# Patient Record
Sex: Female | Born: 2000
Health system: Southern US, Community
[De-identification: ages and names within clinical notes are randomized; demographics above are authoritative.]

## PROBLEM LIST (undated history)

## (undated) DIAGNOSIS — F909 Attention-deficit hyperactivity disorder, unspecified type: Secondary | ICD-10-CM

## (undated) DIAGNOSIS — R278 Other lack of coordination: Secondary | ICD-10-CM

## (undated) HISTORY — PX: WISDOM TOOTH EXTRACTION: SHX21

## (undated) HISTORY — DX: Other lack of coordination: R27.8

## (undated) HISTORY — DX: Attention-deficit hyperactivity disorder, unspecified type: F90.9

---
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
## 2020-01-07 ENCOUNTER — Other Ambulatory Visit: Payer: Self-pay | Admitting: Family Medicine

## 2020-01-14 DIAGNOSIS — H5213 Myopia, bilateral: Secondary | ICD-10-CM | POA: Diagnosis not present

## 2020-01-14 DIAGNOSIS — H10413 Chronic giant papillary conjunctivitis, bilateral: Secondary | ICD-10-CM | POA: Diagnosis not present

## 2020-01-17 ENCOUNTER — Encounter: Payer: BC Managed Care – PPO | Admitting: Pediatrics

## 2020-01-19 ENCOUNTER — Other Ambulatory Visit: Payer: Self-pay

## 2020-01-19 ENCOUNTER — Encounter: Payer: Self-pay | Admitting: Pediatrics

## 2020-01-19 ENCOUNTER — Ambulatory Visit (INDEPENDENT_AMBULATORY_CARE_PROVIDER_SITE_OTHER): Payer: BC Managed Care – PPO | Admitting: Pediatrics

## 2020-01-19 VITALS — Ht 64.0 in | Wt 120.0 lb

## 2020-01-19 DIAGNOSIS — F900 Attention-deficit hyperactivity disorder, predominantly inattentive type: Secondary | ICD-10-CM

## 2020-01-19 DIAGNOSIS — R278 Other lack of coordination: Secondary | ICD-10-CM | POA: Diagnosis not present

## 2020-01-19 DIAGNOSIS — Z79899 Other long term (current) drug therapy: Secondary | ICD-10-CM | POA: Diagnosis not present

## 2020-01-19 DIAGNOSIS — Z0282 Encounter for adoption services: Secondary | ICD-10-CM

## 2020-01-19 DIAGNOSIS — Z7189 Other specified counseling: Secondary | ICD-10-CM

## 2020-01-19 DIAGNOSIS — Z719 Counseling, unspecified: Secondary | ICD-10-CM

## 2020-01-19 MED ORDER — METHYLPHENIDATE HCL ER (OSM) 36 MG PO TBCR: 72.0000 mg | EXTENDED_RELEASE_TABLET | ORAL | 0 refills | Status: DC

## 2020-01-19 MED ORDER — METHYLPHENIDATE HCL 10 MG PO TABS: ORAL_TABLET | ORAL | 0 refills | Status: DC

## 2020-01-19 NOTE — Progress Notes (Signed)
Medication Check  Patient ID: Sylvia Dawson  DOB: [birth year]  MRN: [id number]  DATE:01/19/20 Leveda Anna, NP  Accompanied by: Mother Patient Lives with: mother and father  HISTORY/CURRENT STATUS: Chief Complaint - Polite and cooperative and present for medical follow up for medication management of ADHD, dysgraphia and learning differences. Last follow up by video on 09/13/19 and in person on 08/07/2018.  Currently prescribed Concerta 36 mg BID, and currently taking one in the summer.  Also prescribed Ritalin 10 mg as needed, will take this rather than concerta 36 mg if she is waking up later.    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 Travelling this summer. No employment  Finished most Gen Ed.  - will have math, and will take Eng in Spring Major undecided.  Activities/ Exercise: daily  Second semester did PE/Swim Electives  Screen time: (phone, tablet, TV, computer): some excessive  Lives in apartment with one roommate. - will be random placement. 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  MEDICAL HISTORY: Appetite: WNL   Sleep: Bedtime: 2300  Awakens: 0700   Concerns: Initiation/Maintenance/Other: Asleep easily, sleeps through the night, feels well-rested.  No Sleep concerns. Counseled to have routine sleep with usual bedtime and wake up on schedule. Elimination: no concerns  Individual Medical History/ Review of Systems: Changes? :Yes counselor every other week by video feels it is helpful Reports challenged relationship with on-line bf, lives in [place] only met once during spring break. Advised continuation of counseling and off phones and asleeping set hours.  Family Medical/ Social History: Changes? No  Current Medications:  Concerta 36 mg two daily Ritalin 10 mg prn Medication Side Effects: None  MENTAL HEALTH: Mental Health Issues:   Denies sadness, loneliness or depression. No self harm or thoughts of self harm or injury. Denies fears, worries and anxieties. Has good peer relations and is not a bully nor is victimized.  Review of Systems  Constitutional: Negative.   HENT: Negative.   Eyes: Negative.   Respiratory: Negative.   Cardiovascular: Negative.   Gastrointestinal: Negative.   Genitourinary: Negative.   Musculoskeletal: Negative.   Skin: Negative.   Allergic/Immunologic: Negative.   Neurological: Negative.   Hematological: Negative.   Psychiatric/Behavioral: Negative.  Negative for confusion, decreased concentration and sleep disturbance. The patient is not nervous/anxious and is not hyperactive.   All other systems reviewed and are negative.   PHYSICAL EXAM; Vitals:   01/19/20 1538  Weight: 120 lb (54.4 kg)  Height: 5' 4" (1.626 m)   Body mass index is 20.6 kg/m.  General Physical Exam: Unchanged from previous exam, date:08/07/2018   DIAGNOSES:    ICD-10-CM   1. ADHD (attention deficit hyperactivity disorder), inattentive type  F90.0   2. Dysgraphia  R27.8   3. Adopted  Z02.82   4. Medication management  Z79.899   5. Patient counseled  Z71.9   6. Counseling and coordination of care  Z71.89     RECOMMENDATIONS:  Patient Instructions  DISCUSSION: Counseled regarding the following coordination of care items:  Continue medication as directed Concerta 36 mg one BID Ritalin 10 mg as needed RX for above e-scribed and sent to pharmacy on record  [id number] [place] [place] - [id number] [address]Phone: [phone]: [phone] Counseled regarding obtaining refills by  calling pharmacy first to use automated refill request then if needed, call our office leaving a detailed message on the refill line.  Counseled medication administration, effects, and possible side effects.  ADHD medications discussed to include  different medications and pharmacologic properties of each. Recommendation for specific medication to include dose, administration, expected effects, possible side effects and the risk to benefit ratio of medication management.  Advised importance of:  Good sleep hygiene (8- 10 hours per night)  Limited screen time (none on school nights, no more than 2 hours on weekends)  Regular exercise(outside and active play)  Healthy eating (drink water, no sodas/sweet tea)  Regular family meals have been linked to lower levels of adolescent risk-taking behavior.  Adolescents who frequently eat meals with their family are less likely to engage in risk behaviors than those who never or rarely eat with their families.  So it is never too early to start this tradition.    Counseling at this visit included the review of old records and/or current chart.   Counseling included the following discussion points presented at every visit to improve understanding and treatment compliance.  Recent health history and today's examination Growth and development with anticipatory guidance provided regarding brain growth, executive function maturation and pre or pubertal development. School progress and continued advocay for appropriate accommodations to include maintain Structure, routine, organization, reward, motivation and consequences.  Additionally the patient was counseled to take medication while driving.  Decrease video/screen time including phones, tablets, television and computer games. None on school nights.  Only 2 hours total on weekend days.  Technology bedtime - off devices two hours before sleep  Please only permit age appropriate gaming:    Https://www.commonsensemedia.org/  Setting Parental Controls:  https://endsexualexploitation.org/articles/steam-family-view/ Https://support.google.com/googleplay/answer/1075738?hl=en  To block content on cell phones:   https://ourpact.com/iphone-parental-controls-app/  https://www.missingkids.org/netsmartz/resources#tipsheets  Screen usage is associated with decreased academic success, lower self-esteem and more social isolation. Screens increase Impulsive behaviors, decrease attention necessary for school and it IMPAIRS sleep.     Patient verbalized understanding of all topics discussed.  NEXT APPOINTMENT:  Return in about 6 months (around 07/21/2020) for Medical Follow up.  Medical Decision-making: More than 50% of the appointment was spent counseling and discussing diagnosis and management of symptoms with the patient and family.  Counseling Time: 25 minutes Total Contact Time: 30 minutes  

## 2020-01-19 NOTE — Patient Instructions (Addendum)
DISCUSSION: Counseled regarding the following coordination of care items:  Continue medication as directed Concerta 36 mg one BID Ritalin 10 mg as needed RX for above e-scribed and sent to pharmacy on record  [id number] - [place], [place] - [address] [place] [address] Phone: [phone] Fax: [phone]  Counseled regarding obtaining refills by calling pharmacy first to use automated refill request then if needed, call our office leaving a detailed message on the refill line.  Counseled medication administration, effects, and possible side effects.  ADHD medications discussed to include different medications and pharmacologic properties of each. Recommendation for specific medication to include dose, administration, expected effects, possible side effects and the risk to benefit ratio of medication management.  Advised importance of:  Good sleep hygiene (8- 10 hours per night)  Limited screen time (none on school nights, no more than 2 hours on weekends)  Regular exercise(outside and active play)  Healthy eating (drink water, no sodas/sweet tea)  Regular family meals have been linked to lower levels of adolescent risk-taking behavior.  Adolescents who frequently eat meals with their family are less likely to engage in risk behaviors than those who never or rarely eat with their families.  So it is never too early to start this tradition.    Counseling at this visit included the review of old records and/or current chart.   Counseling included the following discussion points presented at every visit to improve understanding and treatment compliance.  Recent health history and today's examination Growth and development with anticipatory guidance provided regarding brain growth, executive function maturation and pre or pubertal development. School progress and continued advocay for appropriate accommodations to include maintain  Structure, routine, organization, reward, motivation and consequences.  Additionally the patient was counseled to take medication while driving.  Decrease video/screen time including phones, tablets, television and computer games. None on school nights.  Only 2 hours total on weekend days.  Technology bedtime - off devices two hours before sleep  Please only permit age appropriate gaming:    http://knight.com/  Setting Parental Controls:  https://endsexualexploitation.org/articles/steam-family-view/ Https://support.google.com/googleplay/answer/1075738?hl=en  To block content on cell phones:  TownRank.com.cy  https://www.missingkids.org/netsmartz/resources#tipsheets  Screen usage is associated with decreased academic success, lower self-esteem and more social isolation. Screens increase Impulsive behaviors, decrease attention necessary for school and it IMPAIRS sleep.

## 2020-01-20 ENCOUNTER — Encounter: Payer: Self-pay | Admitting: Family Medicine

## 2020-01-20 ENCOUNTER — Ambulatory Visit (HOSPITAL_BASED_OUTPATIENT_CLINIC_OR_DEPARTMENT_OTHER): Payer: BC Managed Care – PPO | Admitting: Family Medicine

## 2020-01-20 ENCOUNTER — Other Ambulatory Visit (HOSPITAL_COMMUNITY)
Admission: RE | Admit: 2020-01-20 | Discharge: 2020-01-20 | Disposition: A | Discharge status: Home / Self Care | Payer: BC Managed Care – PPO | Source: Ambulatory Visit | Attending: Obstetrics & Gynecology | Admitting: Obstetrics & Gynecology

## 2020-01-20 VITALS — BP 122/83 | HR 107 | Ht 64.0 in | Wt 120.0 lb

## 2020-01-20 DIAGNOSIS — Z01419 Encounter for gynecological examination (general) (routine) without abnormal findings: Secondary | ICD-10-CM | POA: Diagnosis not present

## 2020-01-20 DIAGNOSIS — N898 Other specified noninflammatory disorders of vagina: Secondary | ICD-10-CM | POA: Diagnosis not present

## 2020-01-20 DIAGNOSIS — Z113 Encounter for screening for infections with a predominantly sexual mode of transmission: Secondary | ICD-10-CM | POA: Insufficient documentation

## 2020-01-20 MED ORDER — LO LOESTRIN FE 1 MG-10 MCG / 10 MCG PO TABS: 1.0000 | ORAL_TABLET | Freq: Every day | ORAL | 3 refills | Status: DC

## 2020-01-20 NOTE — Progress Notes (Signed)
GYNECOLOGY ANNUAL PREVENTATIVE CARE ENCOUNTER NOTE  Subjective:   Sylvia Dawson is a 19 y.o. No obstetric history on file. female here for a routine annual gynecologic exam.  Current complaints: discharge with strong odor.   Denies abnormal vaginal bleeding, discharge, pelvic pain, problems with intercourse or other gynecologic concerns.    Gynecologic History No LMP recorded. (Menstrual status: Oral contraceptives). Patient is sexually active  Contraception: OCP (estrogen/progesterone) Last Pap: n/a.  Last mammogram: n/a.   Obstetric History OB History  No obstetric history on file.    Past Medical History:  Diagnosis Date  . ADHD (attention deficit hyperactivity disorder)   . Dysgraphia 09/28/2015    Past Surgical History:  Procedure Laterality Date  . WISDOM TOOTH EXTRACTION      Current Outpatient Medications on File Prior to Visit  Medication Sig Dispense Refill  . LO LOESTRIN FE 1 MG-10 MCG / 10 MCG tablet Take 1 tablet by mouth daily. 3 Package 3  . loratadine (CLARITIN) 10 MG tablet Take 10 mg by mouth daily.    . methylphenidate (CONCERTA) 36 MG PO CR tablet Take 2 tablets (72 mg total) by mouth every morning. 60 tablet 0  . methylphenidate (RITALIN) 10 MG tablet May take one tablet in the am for testing days and one in the evening, as needed for homework 60 tablet 0  . Biotin 10 MG CAPS Take by mouth.    . Omega-3 Fatty Acids (FISH OIL) 1000 MG CAPS Take by mouth.    . VENTOLIN HFA 108 (90 Base) MCG/ACT inhaler INHALE 1-2 PUFFS INTO THE LUNGS EVERY 6 (SIX) HOURS AS NEEDED FOR WHEEZING OR SHORTNESS OF BREATH. 8 g 4   No current facility-administered medications on file prior to visit.    Allergies  Allergen Reactions  . Augmentin [Amoxicillin-Pot Clavulanate] Rash    With subsequent rashes after discontinued.    Social History   Socioeconomic History  . Marital status: Single    Spouse name: Not on file  . Number of children: Not on file  . Years of  education: 11  . Highest education level: Not on file  Occupational History  . Not on file  Tobacco Use  . Smoking status: Never Smoker  . Smokeless tobacco: Never Used  Vaping Use  . Vaping Use: Never used  Substance and Sexual Activity  . Alcohol use: No    Alcohol/week: 0.0 standard drinks  . Drug use: No  . Sexual activity: Never    Birth control/protection: Pill  Other Topics Concern  . Not on file  Social History Narrative   Lives with adoptive Mother and Father, no siblings.   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 Difficulty of Paying Living Expenses:   Food Insecurity:   . Worried About Programme researcher, broadcasting/film/video in the Last Year:   . Barista in the Last Year:   Transportation Needs:   . Freight forwarder (Medical):   Marland Kitchen Lack of Transportation (Non-Medical):   Physical Activity:   . Days of Exercise per Week:   . Minutes of Exercise per Session:   Stress:   . Feeling of Stress :   Social Connections:   . Frequency of Communication with Friends and Family:   . Frequency of Social Gatherings with Friends and Family:   . Attends Religious  Services:   . Active Member of Clubs or Organizations:   . Attends Banker Meetings:   Marland Kitchen Marital Status:   Intimate Partner Violence:   . Fear of Current or Ex-Partner:   . Emotionally Abused:   Marland Kitchen Physically Abused:   . Sexually Abused:     Family History  Adopted: Yes  Family history unknown: Yes    The following portions of the patient's history were reviewed and updated as appropriate: allergies, current medications, past family history, past medical history, past social history, past surgical history and problem list.  Review of Systems Pertinent items are noted in HPI.   Objective:  BP 122/83   Pulse (!) 107   Ht 5\' 4"  (1.626 m)   Wt 120 lb (54.4 kg)   BMI 20.60  kg/m  Wt Readings from Last 3 Encounters:  01/20/20 120 lb (54.4 kg) (37 %, Z= -0.33)*  01/11/19 119 lb (54 kg) (40 %, Z= -0.25)*  05/17/18 123 lb 7.3 oz (56 kg) (52 %, Z= 0.06)*   * Growth percentiles are based on CDC (Girls, 2-20 Years) data.     Chaperone present during exam  CONSTITUTIONAL: Well-developed, well-nourished female in no acute distress.  HENT:  Normocephalic, atraumatic, External right and left ear normal. Oropharynx is clear and moist EYES: Conjunctivae and EOM are normal. Pupils are equal, round, and reactive to light. No scleral icterus.  NECK: Normal range of motion, supple, no masses.  Normal thyroid.   CARDIOVASCULAR: Normal heart rate noted, regular rhythm RESPIRATORY: Clear to auscultation bilaterally. Effort and breath sounds normal, no problems with respiration noted. BREASTS: Not indicated ABDOMEN: Soft, normal bowel sounds, no distention noted.  No tenderness, rebound or guarding.  PELVIC: Not indicated. masses, no uterine or adnexal tenderness. MUSCULOSKELETAL: Normal range of motion. No tenderness.  No cyanosis, clubbing, or edema.  2+ distal pulses. SKIN: Skin is warm and dry. No rash noted. Not diaphoretic. No erythema. No pallor. NEUROLOGIC: Alert and oriented to person, place, and time. Normal reflexes, muscle tone coordination. No cranial nerve deficit noted. PSYCHIATRIC: Normal mood and affect. Normal behavior. Normal judgment and thought content.  Assessment:  Annual gynecologic examination with pap smear   Plan:  1. Well Woman Exam STD testing discussed. Patient requested vaginal testing   2. Discharge Wet prep.  Routine preventative health maintenance measures emphasized. Please refer to After Visit Summary for other counseling recommendations.    13/10/19, DO [facility]

## 2020-01-20 NOTE — Progress Notes (Signed)
Patient is taking her pills continously. Patient is attending [employer] Armandina Stammer RN

## 2020-01-20 NOTE — Addendum Note (Signed)
Addended by: Anell Barr on: 01/20/2020 01:57 PM   Modules accepted: Orders

## 2020-01-21 LAB — CERVICOVAGINAL ANCILLARY ONLY
Bacterial Vaginitis (gardnerella): POSITIVE — AB
Candida Glabrata: NEGATIVE
Candida Vaginitis: NEGATIVE
Chlamydia: POSITIVE — AB
Comment: NEGATIVE
Comment: NEGATIVE
Comment: NEGATIVE
Comment: NEGATIVE
Comment: NEGATIVE
Comment: NORMAL
Neisseria Gonorrhea: NEGATIVE
Trichomonas: NEGATIVE

## 2020-01-24 ENCOUNTER — Other Ambulatory Visit: Payer: Self-pay | Admitting: Family Medicine

## 2020-01-24 ENCOUNTER — Telehealth: Payer: Self-pay

## 2020-01-24 MED ORDER — FLUCONAZOLE 150 MG PO TABS: 150.0000 mg | ORAL_TABLET | Freq: Once | ORAL | 0 refills | Status: AC

## 2020-01-24 MED ORDER — METRONIDAZOLE 500 MG PO TABS: 500.0000 mg | ORAL_TABLET | Freq: Two times a day (BID) | ORAL | 0 refills | Status: AC

## 2020-01-24 MED ORDER — AZITHROMYCIN 250 MG PO TABS: 1000.0000 mg | ORAL_TABLET | Freq: Once | ORAL | 0 refills | Status: AC

## 2020-01-24 NOTE — Telephone Encounter (Signed)
Patient called and made aware that she has bacterial vaginosis and chlamydia. Made aware that she will need to the azithromycin (4 tabs) now and give 4 tablets to her partner. Patient states she doesn't have a partner and doesn't know who she had intercourse with who had the infection. Stressed the importance of her not having intercourse until she is treated and using condoms.  Patient also made aware that she has a script for flagyl for the bacterial vaginosis. And a diflucan she can take at the end of all these medications if she develops a yeast infection.  Patient states understanding. Patient also asked which covid vaccine dr. Erin Fulling would recommend and I let her know she would recommend whichever one she can get through [facility] - I made her aware this is usually the pfizer. Armandina Stammer RN

## 2020-02-02 ENCOUNTER — Ambulatory Visit: Payer: Self-pay | Admitting: Obstetrics & Gynecology

## 2020-04-20 DIAGNOSIS — F909 Attention-deficit hyperactivity disorder, unspecified type: Secondary | ICD-10-CM | POA: Diagnosis not present

## 2020-04-21 ENCOUNTER — Other Ambulatory Visit: Payer: Self-pay | Admitting: Pediatrics

## 2020-04-21 MED ORDER — METHYLPHENIDATE HCL ER (OSM) 36 MG PO TBCR: 72.0000 mg | EXTENDED_RELEASE_TABLET | ORAL | 0 refills | Status: DC

## 2020-04-21 MED ORDER — METHYLPHENIDATE HCL 10 MG PO TABS: ORAL_TABLET | ORAL | 0 refills | Status: DC

## 2020-04-21 NOTE — Addendum Note (Signed)
Addended by: Elvera Maria R on: 04/21/2020 05:04 PM   Modules accepted: Orders

## 2020-04-21 NOTE — Telephone Encounter (Signed)
There was an e-prescribing error Resent the Rx for Concerta and methylphenidate IR

## 2020-04-21 NOTE — Telephone Encounter (Signed)
Mother requested refills. RX for above e-scribed and sent to pharmacy on record  [id number] - [place] [place] - [address] [place] [address] Phone: [phone] Fax: [phone]

## 2020-04-26 ENCOUNTER — Telehealth (INDEPENDENT_AMBULATORY_CARE_PROVIDER_SITE_OTHER): Payer: BC Managed Care – PPO | Admitting: Pediatrics

## 2020-04-26 ENCOUNTER — Other Ambulatory Visit: Payer: Self-pay

## 2020-04-26 DIAGNOSIS — F900 Attention-deficit hyperactivity disorder, predominantly inattentive type: Secondary | ICD-10-CM

## 2020-04-26 DIAGNOSIS — Z7189 Other specified counseling: Secondary | ICD-10-CM

## 2020-04-26 DIAGNOSIS — R278 Other lack of coordination: Secondary | ICD-10-CM | POA: Diagnosis not present

## 2020-04-26 DIAGNOSIS — F4329 Adjustment disorder with other symptoms: Secondary | ICD-10-CM | POA: Diagnosis not present

## 2020-04-26 DIAGNOSIS — Z0282 Encounter for adoption services: Secondary | ICD-10-CM

## 2020-04-27 ENCOUNTER — Encounter: Payer: Self-pay | Admitting: Pediatrics

## 2020-04-27 NOTE — Progress Notes (Signed)
[facility] [address], [place] [place] [address] Dept: [phone] Dept Fax: [phone]  Medication Check by Caregility due to COVID-19  Patient ID:  Sylvia Dawson  female DOB: 2000/10/09   19 y.o.   MRN: [id number]   DATE:04/27/20  PCP: Estelle June, NP  Interviewed: Sylvia Dawson and Sylvia Dawson  Name: Sylvia Dawson Location: Their home Provider location: [facility] office  Virtual Visit via Video Note Connected with Sylvia Dawson on 04/27/20 at  2:00 PM EDT by video enabled telemedicine application and verified that I am speaking with the correct person using two identifiers.     I discussed the limitations, risks, security and privacy concerns of performing an evaluation and management service by telephone and the availability of in person appointments. I also discussed with the parent/patient that there may be a patient responsible charge related to this service. The parent/patient expressed understanding and agreed to proceed.  HISTORY OF PRESENT ILLNESS/CURRENT STATUS: Sylvia Dawson is being followed for medication management for ADHD, dysgraphia and learning differences.   Last visit on 01/19/2020  Sylvia Dawson currently prescribed Concerta 36 mg one or two daily, with short acting ritalin 10 mg as needed.    Behaviors: Sylvia Dawson extremely concerned with inconsistent medication use.  Has enmeshed relationship with boyfriend of one year who is in HS in [place] (same birth age but 3 years younger in school).  EDUCATION: School: Sylvia Dawson Year/Grade: sophomore year Sylvia Dawson concerned for academic progress and social immaturity  Activities/ Exercise: daily  Limited activities. Boyfriend has her on 360 app and text her when she is out of her circle  Screen time: (phone, tablet, TV, computer): non-essential, excessive with BF  MEDICAL HISTORY: Individual Medical History/ Review of Systems: Changes? :No  Family  Medical/ Social History: Changes? No Patient Lives with: housing  Current Medications:  Inconsistant Medication Side Effects: None  MENTAL HEALTH: Sylvia Dawson rightfully worried regarding toxicity of relationship Advised to see continued counseling with local provider in [place] Also may need transition to psychiatry due to mental health concerns of low self esteem, and stagnation of progressing to confident sense of self  DIAGNOSES:    ICD-10-CM   1. ADHD (attention deficit hyperactivity disorder), inattentive type  F90.0   2. Dysgraphia  R27.8   3. Adopted  Z02.82   4. 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 Parenting dynamics counseling  Z71.89   6. Counseling and coordination of care  Z71.89      RECOMMENDATIONS:  Patient Instructions  DISCUSSION: Counseled regarding the following coordination of care items:  Continue medication as directed  Counseled regarding obtaining refills by calling pharmacy first to use automated refill request then if needed, call our office leaving a detailed message on the refill line.  Counseled medication administration, effects, and possible side effects.  ADHD medications discussed to include different medications and pharmacologic properties of each. Recommendation for specific medication to include dose, administration, expected effects, possible side effects and the risk to benefit ratio of medication management.  Sylvia Dawson advised to recommend continued counseling for patient and continue to encourage in person engagement for social activities.      NEXT APPOINTMENT:  Return in about 2 weeks (around 05/10/2020) for Medication Check. Please call the office for a sooner appointment if problems arise.  Medical Decision-making: More than 50% of the appointment was spent counseling and discussing diagnosis and management of symptoms with  the parent/patient.  I discussed the assessment and treatment plan with the  parent. The parent/patient was provided an opportunity to ask questions and all were answered. The parent/patient agreed with the plan and demonstrated an understanding of the instructions.   The parent/patient was advised to call back or seek an in-person evaluation if the symptoms worsen or if the condition fails to improve as anticipated.  I provided 35 minutes of non-face-to-face time during this encounter.   Completed record review for 10 minutes prior to the virtual video visit.   Sylvia Dawson A Harrold Donath, NP  Counseling Time: 35 minutes   Total Contact Time: 45 minutes

## 2020-04-27 NOTE — Patient Instructions (Signed)
DISCUSSION: Counseled regarding the following coordination of care items:  Continue medication as directed  Counseled regarding obtaining refills by calling pharmacy first to use automated refill request then if needed, call our office leaving a detailed message on the refill line.  Counseled medication administration, effects, and possible side effects.  ADHD medications discussed to include different medications and pharmacologic properties of each. Recommendation for specific medication to include dose, administration, expected effects, possible side effects and the risk to benefit ratio of medication management.  Mother advised to recommend continued counseling for patient and continue to encourage in person engagement for social activities.

## 2020-05-03 ENCOUNTER — Encounter: Payer: Self-pay | Admitting: Pediatrics

## 2020-05-03 ENCOUNTER — Telehealth (INDEPENDENT_AMBULATORY_CARE_PROVIDER_SITE_OTHER): Payer: BC Managed Care – PPO | Admitting: Pediatrics

## 2020-05-03 ENCOUNTER — Other Ambulatory Visit: Payer: Self-pay

## 2020-05-03 DIAGNOSIS — Z79899 Other long term (current) drug therapy: Secondary | ICD-10-CM | POA: Diagnosis not present

## 2020-05-03 DIAGNOSIS — F900 Attention-deficit hyperactivity disorder, predominantly inattentive type: Secondary | ICD-10-CM

## 2020-05-03 DIAGNOSIS — Z7189 Other specified counseling: Secondary | ICD-10-CM

## 2020-05-03 DIAGNOSIS — Z0282 Encounter for adoption services: Secondary | ICD-10-CM

## 2020-05-03 DIAGNOSIS — R278 Other lack of coordination: Secondary | ICD-10-CM | POA: Diagnosis not present

## 2020-05-03 DIAGNOSIS — Z719 Counseling, unspecified: Secondary | ICD-10-CM

## 2020-05-03 MED ORDER — AMPHETAMINE SULFATE 10 MG PO TABS: 5.0000 mg | ORAL_TABLET | Freq: Every morning | ORAL | 0 refills | Status: AC

## 2020-05-03 NOTE — Patient Instructions (Signed)
DISCUSSION: Counseled regarding the following coordination of care items:  Discontinue Concerta and short acting methylphenidate  Trial Evekeo 10 mg begin with 1/2 tablet in the morning, may use full tablet.  May add 1/2 tablet to afternoon for activities/homework. RX for above e-scribed and sent to pharmacy on record  [id number] - [place], [place] - [address] [place] [address] Phone: [phone] Fax: [phone]  Counseled regarding obtaining refills by calling pharmacy first to use automated refill request then if needed, call our office leaving a detailed message on the refill line.  Counseled medication administration, effects, and possible side effects.  ADHD medications discussed to include different medications and pharmacologic properties of each. Recommendation for specific medication to include dose, administration, expected effects, possible side effects and the risk to benefit ratio of medication management.  Advised importance of:  Good sleep hygiene (8- 10 hours per night)  Limited screen time (none on school nights, no more than 2 hours on weekends)  Regular exercise(outside and active play)  Healthy eating (drink water, no sodas/sweet tea)  Regular family meals have been linked to lower levels of adolescent risk-taking behavior.  Adolescents who frequently eat meals with their family are less likely to engage in risk behaviors than those who never or rarely eat with their families.  So it is never too early to start this tradition.  Counseling at this visit included the review of old records and/or current chart.   Counseling included the following discussion points presented at every visit to improve understanding and treatment compliance.  Recent health history and today's examination Growth and development with anticipatory guidance provided regarding brain growth, executive function maturation and pre or  pubertal development. School progress and continued advocay for appropriate accommodations to include maintain Structure, routine, organization, reward, motivation and consequences.  Additionally the patient was counseled to take medication while driving.
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
## 2020-05-04 ENCOUNTER — Telehealth: Payer: Self-pay

## 2020-05-04 NOTE — Telephone Encounter (Signed)
Pharm faxed in Prior Auth for Evekeo. Last visit 05/03/2020. Submitting Prior Auth to [employer]

## 2020-05-25 DIAGNOSIS — F900 Attention-deficit hyperactivity disorder, predominantly inattentive type: Secondary | ICD-10-CM | POA: Diagnosis not present

## 2020-06-07 ENCOUNTER — Other Ambulatory Visit: Payer: Self-pay

## 2020-06-07 ENCOUNTER — Encounter: Payer: Self-pay | Admitting: Family Medicine

## 2020-06-07 ENCOUNTER — Ambulatory Visit (INDEPENDENT_AMBULATORY_CARE_PROVIDER_SITE_OTHER): Payer: BC Managed Care – PPO | Admitting: Family Medicine

## 2020-06-07 VITALS — BP 120/55 | HR 71 | Wt 123.0 lb

## 2020-06-07 DIAGNOSIS — F3281 Premenstrual dysphoric disorder: Secondary | ICD-10-CM

## 2020-06-07 DIAGNOSIS — N921 Excessive and frequent menstruation with irregular cycle: Secondary | ICD-10-CM

## 2020-06-07 NOTE — Progress Notes (Addendum)
Subjective:    Patient ID: Sylvia Dawson, female    DOB: 2000/10/13, 19 y.o.   MRN: [id number]  HPI Patient seen for f.u of birth control. Seen with mother. On Lo loestrin (5mg estrogen COC). Sometimes has period on non-hormone days, but also sometimes having menses on the second week of the pack. Never has both menses and breakthrough bleeding. Finds that this happens more often when she has increased stress. Did try loestrin (23m estrogen), but did not tolerate this well - nausea, upset stomach - but only tried it for a few weeks.   Also gets irritated with mood changes a few days prior to her menses ("I can be a real bitch"). Is seeing a counselor, who recommended some labs.   Review of Systems     Objective:   Physical Exam Vitals reviewed.  Cardiovascular:     Rate and Rhythm: Normal rate.     Pulses: Normal pulses.  Pulmonary:     Effort: Pulmonary effort is normal.  Skin:    Capillary Refill: Capillary refill takes less than 2 seconds.  Neurological:     General: No focal deficit present.  Psychiatric:        Mood and Affect: Mood normal.        Behavior: Behavior normal.        Thought Content: Thought content normal.        Judgment: Judgment normal.       Assessment & Plan:  1. Breakthrough bleeding on birth control pills Discussed increasing dose of estrogen again and giving longer trial - may change progesterone and see if that helps. At this point, patient does not want to change COCs.   2. PMDD (premenstrual dysphoric disorder) Discussed that this is common and due to hormone drop. Increasing the estrogen dose and doing it continuously may help with these symptoms. Patient currently does not want to change. - TSH - Comp Met (CMET) - CBC - Hemoglobin A1c

## 2020-06-08 DIAGNOSIS — F900 Attention-deficit hyperactivity disorder, predominantly inattentive type: Secondary | ICD-10-CM | POA: Diagnosis not present

## 2020-06-08 LAB — COMPREHENSIVE METABOLIC PANEL
ALT: 15 IU/L (ref 0–32)
AST: 13 IU/L (ref 0–40)
Albumin/Globulin Ratio: 2.1 (ref 1.2–2.2)
Albumin: 4.9 g/dL (ref 3.9–5.0)
Alkaline Phosphatase: 42 IU/L (ref 42–106)
BUN/Creatinine Ratio: 17 (ref 9–23)
BUN: 12 mg/dL (ref 6–20)
Bilirubin Total: 0.3 mg/dL (ref 0.0–1.2)
CO2: 21 mmol/L (ref 20–29)
Calcium: 9.7 mg/dL (ref 8.7–10.2)
Chloride: 104 mmol/L (ref 96–106)
Creatinine, Ser: 0.7 mg/dL (ref 0.57–1.00)
GFR calc Af Amer: 145 mL/min/{1.73_m2} (ref >59)
GFR calc non Af Amer: 126 mL/min/{1.73_m2} (ref >59)
Globulin, Total: 2.3 g/dL (ref 1.5–4.5)
Glucose: 91 mg/dL (ref 65–99)
Potassium: 4.4 mmol/L (ref 3.5–5.2)
Sodium: 140 mmol/L (ref 134–144)
Total Protein: 7.2 g/dL (ref 6.0–8.5)

## 2020-06-08 LAB — CBC
Hematocrit: 43.1 % (ref 34.0–46.6)
Hemoglobin: 14.4 g/dL (ref 11.1–15.9)
MCH: 28.5 pg (ref 26.6–33.0)
MCHC: 33.4 g/dL (ref 31.5–35.7)
MCV: 85 fL (ref 79–97)
Platelets: 223 10*3/uL (ref 150–450)
RBC: 5.05 x10E6/uL (ref 3.77–5.28)
RDW: 12.8 % (ref 11.7–15.4)
WBC: 4.9 10*3/uL (ref 3.4–10.8)

## 2020-06-08 LAB — HEMOGLOBIN A1C
Est. average glucose Bld gHb Est-mCnc: 105 mg/dL
Hgb A1c MFr Bld: 5.3 % (ref 4.8–5.6)

## 2020-06-08 LAB — TSH: TSH: 0.955 u[IU]/mL (ref 0.450–4.500)

## 2020-06-12 DIAGNOSIS — F900 Attention-deficit hyperactivity disorder, predominantly inattentive type: Secondary | ICD-10-CM | POA: Diagnosis not present

## 2020-06-21 DIAGNOSIS — F900 Attention-deficit hyperactivity disorder, predominantly inattentive type: Secondary | ICD-10-CM | POA: Diagnosis not present

## 2020-06-27 DIAGNOSIS — F900 Attention-deficit hyperactivity disorder, predominantly inattentive type: Secondary | ICD-10-CM | POA: Diagnosis not present

## 2020-07-14 DIAGNOSIS — F900 Attention-deficit hyperactivity disorder, predominantly inattentive type: Secondary | ICD-10-CM | POA: Diagnosis not present

## 2020-08-02 DIAGNOSIS — F900 Attention-deficit hyperactivity disorder, predominantly inattentive type: Secondary | ICD-10-CM | POA: Diagnosis not present

## 2020-08-22 DIAGNOSIS — F900 Attention-deficit hyperactivity disorder, predominantly inattentive type: Secondary | ICD-10-CM | POA: Diagnosis not present

## 2020-09-08 DIAGNOSIS — F900 Attention-deficit hyperactivity disorder, predominantly inattentive type: Secondary | ICD-10-CM | POA: Diagnosis not present

## 2020-09-28 DIAGNOSIS — F900 Attention-deficit hyperactivity disorder, predominantly inattentive type: Secondary | ICD-10-CM | POA: Diagnosis not present

## 2020-10-26 DIAGNOSIS — F900 Attention-deficit hyperactivity disorder, predominantly inattentive type: Secondary | ICD-10-CM | POA: Diagnosis not present

## 2020-11-16 DIAGNOSIS — F900 Attention-deficit hyperactivity disorder, predominantly inattentive type: Secondary | ICD-10-CM | POA: Diagnosis not present

## 2020-12-08 DIAGNOSIS — F900 Attention-deficit hyperactivity disorder, predominantly inattentive type: Secondary | ICD-10-CM | POA: Diagnosis not present

## 2020-12-12 ENCOUNTER — Telehealth: Payer: Self-pay | Admitting: Family Medicine

## 2020-12-12 MED ORDER — LO LOESTRIN FE 1 MG-10 MCG / 10 MCG PO TABS: 1.0000 | ORAL_TABLET | Freq: Every day | ORAL | 3 refills | Status: DC

## 2020-12-12 NOTE — Telephone Encounter (Signed)
Medication refilled

## 2020-12-12 NOTE — Telephone Encounter (Signed)
-----   Message from Kathie Dike, [place] sent at 12/11/2020 11:57 AM EDT ----- Regarding: FW: Meds Are you okay refilling this? [place] and annual is due next month.  ----- Message ----- From: Marti Sleigh, [place] Sent: 12/11/2020   9:50 AM EDT To: [facility] Subject: Meds                                           Patient lives outside of [place].  Requesting a refill on BC pills.  Her last Annual with Dr. Adrian Blackwater was in July.  Not sure if she can get one refill.  Will need to call patient at [phone].  Thx

## 2020-12-27 DIAGNOSIS — F900 Attention-deficit hyperactivity disorder, predominantly inattentive type: Secondary | ICD-10-CM | POA: Diagnosis not present

## 2021-01-23 DIAGNOSIS — F900 Attention-deficit hyperactivity disorder, predominantly inattentive type: Secondary | ICD-10-CM | POA: Diagnosis not present

## 2021-01-25 ENCOUNTER — Ambulatory Visit: Payer: BC Managed Care – PPO | Admitting: Family Medicine

## 2021-02-15 ENCOUNTER — Ambulatory Visit: Payer: BC Managed Care – PPO | Admitting: Family Medicine

## 2021-02-16 DIAGNOSIS — F900 Attention-deficit hyperactivity disorder, predominantly inattentive type: Secondary | ICD-10-CM | POA: Diagnosis not present

## 2021-03-16 DIAGNOSIS — F900 Attention-deficit hyperactivity disorder, predominantly inattentive type: Secondary | ICD-10-CM | POA: Diagnosis not present

## 2021-03-30 ENCOUNTER — Ambulatory Visit: Payer: BC Managed Care – PPO | Admitting: Family Medicine

## 2021-04-05 ENCOUNTER — Other Ambulatory Visit: Payer: Self-pay

## 2021-04-05 MED ORDER — LO LOESTRIN FE 1 MG-10 MCG / 10 MCG PO TABS: 1.0000 | ORAL_TABLET | Freq: Every day | ORAL | 0 refills | Status: DC

## 2021-04-06 DIAGNOSIS — F900 Attention-deficit hyperactivity disorder, predominantly inattentive type: Secondary | ICD-10-CM | POA: Diagnosis not present

## 2021-04-13 DIAGNOSIS — F900 Attention-deficit hyperactivity disorder, predominantly inattentive type: Secondary | ICD-10-CM | POA: Diagnosis not present

## 2021-04-27 DIAGNOSIS — F900 Attention-deficit hyperactivity disorder, predominantly inattentive type: Secondary | ICD-10-CM | POA: Diagnosis not present

## 2021-05-03 ENCOUNTER — Ambulatory Visit: Payer: BC Managed Care – PPO | Admitting: Family Medicine

## 2021-05-10 ENCOUNTER — Other Ambulatory Visit: Payer: Self-pay

## 2021-05-10 MED ORDER — LO LOESTRIN FE 1 MG-10 MCG / 10 MCG PO TABS: 1.0000 | ORAL_TABLET | Freq: Every day | ORAL | 0 refills | Status: DC
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
## 2021-05-15 DIAGNOSIS — F900 Attention-deficit hyperactivity disorder, predominantly inattentive type: Secondary | ICD-10-CM | POA: Diagnosis not present

## 2021-05-22 ENCOUNTER — Institutional Professional Consult (permissible substitution): Payer: Self-pay | Admitting: Pediatrics

## 2021-05-28 DIAGNOSIS — F900 Attention-deficit hyperactivity disorder, predominantly inattentive type: Secondary | ICD-10-CM | POA: Diagnosis not present

## 2021-06-19 DIAGNOSIS — F900 Attention-deficit hyperactivity disorder, predominantly inattentive type: Secondary | ICD-10-CM | POA: Diagnosis not present

## 2021-06-22 ENCOUNTER — Ambulatory Visit: Payer: BC Managed Care – PPO | Admitting: Family Medicine

## 2021-07-19 DIAGNOSIS — F900 Attention-deficit hyperactivity disorder, predominantly inattentive type: Secondary | ICD-10-CM | POA: Diagnosis not present

## 2021-08-03 ENCOUNTER — Telehealth: Payer: Self-pay | Admitting: Pediatrics

## 2021-08-03 NOTE — Telephone Encounter (Signed)
Dad called to cancel appointment for daughter, didn't mention rescheduling appointment.

## 2021-08-06 ENCOUNTER — Institutional Professional Consult (permissible substitution): Payer: Self-pay | Admitting: Pediatrics

## 2021-08-21 DIAGNOSIS — F900 Attention-deficit hyperactivity disorder, predominantly inattentive type: Secondary | ICD-10-CM | POA: Diagnosis not present

## 2021-09-18 DIAGNOSIS — F900 Attention-deficit hyperactivity disorder, predominantly inattentive type: Secondary | ICD-10-CM | POA: Diagnosis not present

## 2021-10-22 DIAGNOSIS — F900 Attention-deficit hyperactivity disorder, predominantly inattentive type: Secondary | ICD-10-CM | POA: Diagnosis not present

## 2021-11-20 DIAGNOSIS — F900 Attention-deficit hyperactivity disorder, predominantly inattentive type: Secondary | ICD-10-CM | POA: Diagnosis not present

## 2021-12-10 ENCOUNTER — Other Ambulatory Visit: Payer: Self-pay

## 2021-12-10 MED ORDER — LO LOESTRIN FE 1 MG-10 MCG / 10 MCG PO TABS
1.0000 | ORAL_TABLET | Freq: Every day | ORAL | 2 refills | Status: DC
Start: 2021-12-10 — End: 2022-04-17

## 2021-12-19 DIAGNOSIS — F900 Attention-deficit hyperactivity disorder, predominantly inattentive type: Secondary | ICD-10-CM | POA: Diagnosis not present

## 2022-01-18 DIAGNOSIS — F900 Attention-deficit hyperactivity disorder, predominantly inattentive type: Secondary | ICD-10-CM | POA: Diagnosis not present

## 2022-02-21 DIAGNOSIS — F900 Attention-deficit hyperactivity disorder, predominantly inattentive type: Secondary | ICD-10-CM | POA: Diagnosis not present

## 2022-03-06 ENCOUNTER — Ambulatory Visit: Payer: BC Managed Care – PPO | Admitting: Family Medicine

## 2022-03-22 DIAGNOSIS — F900 Attention-deficit hyperactivity disorder, predominantly inattentive type: Secondary | ICD-10-CM | POA: Diagnosis not present

## 2022-04-11 ENCOUNTER — Other Ambulatory Visit: Payer: Self-pay | Admitting: Family Medicine

## 2022-04-17 ENCOUNTER — Ambulatory Visit: Payer: BC Managed Care – PPO | Admitting: Family Medicine

## 2022-04-17 ENCOUNTER — Other Ambulatory Visit: Payer: Self-pay

## 2022-04-17 DIAGNOSIS — Z3041 Encounter for surveillance of contraceptive pills: Secondary | ICD-10-CM

## 2022-04-17 MED ORDER — LO LOESTRIN FE 1 MG-10 MCG / 10 MCG PO TABS: 1.0000 | ORAL_TABLET | Freq: Every day | ORAL | 3 refills | Status: DC

## 2022-05-09 DIAGNOSIS — F900 Attention-deficit hyperactivity disorder, predominantly inattentive type: Secondary | ICD-10-CM | POA: Diagnosis not present

## 2022-06-12 ENCOUNTER — Ambulatory Visit: Payer: BC Managed Care – PPO | Admitting: Family Medicine

## 2022-06-13 DIAGNOSIS — F900 Attention-deficit hyperactivity disorder, predominantly inattentive type: Secondary | ICD-10-CM | POA: Diagnosis not present

## 2022-07-18 DIAGNOSIS — F900 Attention-deficit hyperactivity disorder, predominantly inattentive type: Secondary | ICD-10-CM | POA: Diagnosis not present

## 2022-08-01 ENCOUNTER — Ambulatory Visit: Payer: BC Managed Care – PPO | Admitting: Family Medicine

## 2022-08-15 ENCOUNTER — Other Ambulatory Visit: Payer: Self-pay | Admitting: Family Medicine

## 2022-08-15 DIAGNOSIS — Z3041 Encounter for surveillance of contraceptive pills: Secondary | ICD-10-CM

## 2022-08-29 DIAGNOSIS — F900 Attention-deficit hyperactivity disorder, predominantly inattentive type: Secondary | ICD-10-CM | POA: Diagnosis not present

## 2022-09-26 ENCOUNTER — Ambulatory Visit: Payer: BC Managed Care – PPO | Admitting: Family Medicine

## 2022-11-21 ENCOUNTER — Ambulatory Visit: Payer: BC Managed Care – PPO | Admitting: Family Medicine

## 2022-11-22 DIAGNOSIS — F900 Attention-deficit hyperactivity disorder, predominantly inattentive type: Secondary | ICD-10-CM | POA: Diagnosis not present

## 2022-12-18 ENCOUNTER — Ambulatory Visit: Payer: BC Managed Care – PPO | Admitting: Family Medicine

## 2022-12-27 DIAGNOSIS — F900 Attention-deficit hyperactivity disorder, predominantly inattentive type: Secondary | ICD-10-CM | POA: Diagnosis not present

## 2023-01-23 DIAGNOSIS — F900 Attention-deficit hyperactivity disorder, predominantly inattentive type: Secondary | ICD-10-CM | POA: Diagnosis not present

## 2023-03-19 DIAGNOSIS — F900 Attention-deficit hyperactivity disorder, predominantly inattentive type: Secondary | ICD-10-CM | POA: Diagnosis not present

## 2023-04-22 DIAGNOSIS — F900 Attention-deficit hyperactivity disorder, predominantly inattentive type: Secondary | ICD-10-CM | POA: Diagnosis not present

## 2023-05-27 DIAGNOSIS — F900 Attention-deficit hyperactivity disorder, predominantly inattentive type: Secondary | ICD-10-CM | POA: Diagnosis not present

## 2023-06-25 ENCOUNTER — Telehealth: Payer: Self-pay

## 2023-06-25 DIAGNOSIS — Z3041 Encounter for surveillance of contraceptive pills: Secondary | ICD-10-CM

## 2023-06-25 MED ORDER — LO LOESTRIN FE 1 MG-10 MCG / 10 MCG PO TABS: 1.0000 | ORAL_TABLET | Freq: Every day | ORAL | 0 refills | Status: AC

## 2023-06-25 NOTE — Telephone Encounter (Signed)
Attempted to contact the patient and her mother to schedule an annual with Dr. Adrian Blackwater. Left our contact information and let them know that we need to see Sylvia Dawson in the office in order get get future refills on her birth control.
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
## 2023-06-25 NOTE — Addendum Note (Signed)
Addended by: Levie Heritage on: 06/25/2023 12:45 PM   Modules accepted: Orders

## 2023-06-27 DIAGNOSIS — F900 Attention-deficit hyperactivity disorder, predominantly inattentive type: Secondary | ICD-10-CM | POA: Diagnosis not present
